# Patient Record
Sex: Male | Born: 1955 | Race: White | Hispanic: No | Marital: Single | Smoking: Former smoker
Health system: Southern US, Community
[De-identification: ages and names within clinical notes are randomized; demographics above are authoritative.]

## PROBLEM LIST (undated history)

## (undated) DIAGNOSIS — E119 Type 2 diabetes mellitus without complications: Secondary | ICD-10-CM

## (undated) DIAGNOSIS — H47011 Ischemic optic neuropathy, right eye: Secondary | ICD-10-CM

## (undated) DIAGNOSIS — I10 Essential (primary) hypertension: Secondary | ICD-10-CM

## (undated) DIAGNOSIS — E079 Disorder of thyroid, unspecified: Secondary | ICD-10-CM

## (undated) DIAGNOSIS — H6090 Unspecified otitis externa, unspecified ear: Secondary | ICD-10-CM

## (undated) DIAGNOSIS — E669 Obesity, unspecified: Secondary | ICD-10-CM

## (undated) DIAGNOSIS — K219 Gastro-esophageal reflux disease without esophagitis: Secondary | ICD-10-CM

---

## 2006-09-04 ENCOUNTER — Ambulatory Visit: Payer: Self-pay | Admitting: Unknown Physician Specialty

## 2009-08-14 ENCOUNTER — Emergency Department: Payer: Self-pay | Admitting: Internal Medicine

## 2012-03-18 ENCOUNTER — Ambulatory Visit: Payer: Self-pay | Admitting: Unknown Physician Specialty

## 2016-12-09 ENCOUNTER — Ambulatory Visit
Admission: EM | Admit: 2016-12-09 | Discharge: 2016-12-09 | Disposition: A | Discharge status: Home / Self Care | Payer: 59 | Attending: Family Medicine | Admitting: Family Medicine

## 2016-12-09 ENCOUNTER — Encounter: Payer: Self-pay | Admitting: Gynecology

## 2016-12-09 DIAGNOSIS — M71021 Abscess of bursa, right elbow: Secondary | ICD-10-CM

## 2016-12-09 DIAGNOSIS — W19XXXA Unspecified fall, initial encounter: Secondary | ICD-10-CM

## 2016-12-09 DIAGNOSIS — M25521 Pain in right elbow: Secondary | ICD-10-CM | POA: Diagnosis not present

## 2016-12-09 HISTORY — DX: Disorder of thyroid, unspecified: E07.9

## 2016-12-09 HISTORY — DX: Type 2 diabetes mellitus without complications: E11.9

## 2016-12-09 HISTORY — DX: Gastro-esophageal reflux disease without esophagitis: K21.9

## 2016-12-09 HISTORY — DX: Ischemic optic neuropathy, right eye: H47.011

## 2016-12-09 HISTORY — DX: Obesity, unspecified: E66.9

## 2016-12-09 HISTORY — DX: Essential (primary) hypertension: I10

## 2016-12-09 HISTORY — DX: Unspecified otitis externa, unspecified ear: H60.90

## 2016-12-09 MED ORDER — MUPIROCIN 2 % EX OINT: 1.0000 "application " | TOPICAL_OINTMENT | Freq: Two times a day (BID) | CUTANEOUS | 0 refills | Status: AC

## 2016-12-09 MED ORDER — CEFTRIAXONE SODIUM 1 G IJ SOLR
1.0000 g | Freq: Once | INTRAMUSCULAR | Status: AC
  Administered 2016-12-09: 1 g via INTRAMUSCULAR

## 2016-12-09 MED ORDER — SULFAMETHOXAZOLE-TRIMETHOPRIM 800-160 MG PO TABS: 1.0000 | ORAL_TABLET | Freq: Two times a day (BID) | ORAL | 0 refills | Status: AC

## 2016-12-09 NOTE — ED Provider Notes (Signed)
[facility]    CSN: [id number] Arrival date & time: 12/09/16  1028     History   Chief Complaint Chief Complaint  Patient presents with  . Elbow Problem    HPI Daniel Evans is a 61 y.o. male.   Patient is a 61 year old white male has history diabetes and hypertension who states he slipped out of his [employer]iece going to the house and landed on the floor. He does not remember hitting his right elbowbasically a pus pocket developed a right elbow swelling the right elbow redness and inflammation. He states that the left leg they don't use that he landed on his doing fine no loss of consciousness. Right elbow continued to swell and get red. He has a history of eczema and dry skin of his extremities. We'll see does not remember hitting the elbow but pus pocket did develop very shortly after that had continued pain and the pain has continued to get worse as elbow Red Swollen   The history is provided by the patient. No language interpreter was used.  Arm Injury  Location:  Elbow Elbow location:  R elbow Injury: yes   Mechanism of injury: fall   Fall:    Fall occurred:  From a Automotive engineer of impact:  Unable to specify   Entrapped after fall: no   Handedness:  Right-handed Worsened by:  Nothing   Past Medical History:  Diagnosis Date  . 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 Diabetes mellitus without complication (HCC)   . GERD (gastroesophageal reflux disease)   . Hypertension   . Obesity (BMI 35.0-39.9 without comorbidity)   . Otitis externa   . Thyroid disease     There are no active problems to display for this patient.   History reviewed. No pertinent surgical history.     Home Medications    Prior to Admission medications   Medication Sig Start Date End Date Taking? Authorizing Provider  acetaminophen (TYLENOL) 500 MG tablet Take 500 mg by mouth every 6 (six) hours as needed.   Yes [provider]  aspirin EC 81 MG tablet Take 81 mg  by mouth daily.   Yes [provider]  atorvastatin (LIPITOR) 20 MG tablet Take 20 mg by mouth daily.   Yes [provider]  fluticasone (VERAMYST) 27.5 MCG/SPRAY nasal spray Place 2 sprays into the nose daily.   Yes [provider]  glucose blood test strip 1 each by Other route as needed for other. Use as instructed   Yes [provider]  glyBURIDE (DIABETA) 5 MG tablet Take 5 mg by mouth daily with breakfast.   Yes [provider]  Insulin Glargine (LANTUS) 100 UNIT/ML Solostar Pen Inject into the skin daily at 10 pm.   Yes [provider]  insulin lispro protamine-lispro (HUMALOG 50/50 MIX) (50-50) 100 UNIT/ML SUSP injection Inject into the skin 2 (two) times daily before a meal.   Yes [provider]  levothyroxine (SYNTHROID, LEVOTHROID) 88 MCG tablet Take 88 mcg by mouth daily before breakfast.   Yes [provider]  lisinopril (PRINIVIL,ZESTRIL) 40 MG tablet Take 40 mg by mouth daily.   Yes [provider]  metFORMIN (GLUCOPHAGE) 850 MG tablet Take 850 mg by mouth 2 (two) times daily with a meal.   Yes [provider]  Multiple Vitamins-Minerals (MULTIVITAMIN WITH MINERALS) tablet Take 1 tablet by mouth daily.   Yes [provider]  ranitidine (ZANTAC) 150 MG  tablet Take 150 mg by mouth 2 (two) times daily.   Yes [provider]  mupirocin ointment (BACTROBAN) 2 % Apply 1 application topically 2 (two) times daily. 12/09/16   Hassan Rowan, MD  sulfamethoxazole-trimethoprim (BACTRIM DS,SEPTRA DS) 800-160 MG tablet Take 1 tablet by mouth 2 (two) times daily. 12/09/16 12/16/16  Hassan Rowan, MD    Family History No family history on file.  Social History Social History  Substance Use Topics  . Smoking status: Current Every Day Smoker    Packs/day: 1.00    Types: Cigarettes  . Smokeless tobacco: Never Used  . Alcohol use No     Allergies   Patient has no known  allergies.   Review of Systems Review of Systems  Musculoskeletal: Positive for arthralgias, joint swelling and myalgias.  All other systems reviewed and are negative.    Physical Exam Triage Vital Signs ED Triage Vitals  Enc Vitals Group     BP 12/09/16 1112 (!) 142/72     Pulse Rate 12/09/16 1112 78     Resp 12/09/16 1112 16     Temp 12/09/16 1112 98.1 F (36.7 C)     Temp Source 12/09/16 1112 Oral     SpO2 12/09/16 1112 97 %     Weight 12/09/16 1111 293 lb (132.9 kg)     Height 12/09/16 1111  (1.778 m)     Head Circumference --      Peak Flow --      Pain Score 12/09/16 1114 2     Pain Loc --      Pain Edu? --      Excl. in GC? --    No data found.   Updated Vital Signs BP (!) 142/72 (BP Location: Left Arm)   Pulse 78   Temp 98.1 F (36.7 C) (Oral)   Resp 16   Ht  (1.778 m)   Wt 293 lb (132.9 kg)   SpO2 97%   BMI 42.04 kg/m   Visual Acuity Right Eye Distance:   Left Eye Distance:   Bilateral Distance:    Right Eye Near:   Left Eye Near:    Bilateral Near:     Physical Exam  Constitutional: He is oriented to person, place, and time. He appears well-developed.  HENT:  Head: Normocephalic and atraumatic.  Eyes: Pupils are equal, round, and reactive to light.  Neck: Normal range of motion. Neck supple.  Pulmonary/Chest: Effort normal.  Musculoskeletal: He exhibits tenderness.       Right elbow: He exhibits swelling and effusion.       Arms: Neurological: He is alert and oriented to person, place, and time.  Skin: Lesion noted. There is erythema.     Over the right elbow bursa is a small area see a pus pocket there is a large 1 and a small one as well. This redness around the elbow as well  Psychiatric: He has a normal mood and affect.  Vitals reviewed.    UC Treatments / Results  Labs (all labs ordered are listed, but only abnormal results are displayed) Labs Reviewed  AEROBIC CULTURE (SUPERFICIAL SPECIMEN)    EKG  EKG  Interpretation None       Radiology No results found.  Procedures .Marland KitchenIncision and Drainage Date/Time: 12/09/2016 1:10 PM Performed by: Hassan Rowan Authorized by: Hassan Rowan   Consent:    Consent obtained:  Verbal Location:    Type:  Bursa   Location:  Upper extremity  Upper extremity location:  Elbow   Elbow location:  R elbow Pre-procedure details:    Skin preparation:  Betadine and Chloraprep Procedure type:    Complexity:  Simple Procedure details:    Incision types:  Stab incision   Incision depth:  Submucosal   Scalpel blade:  11   Wound management:  Probed and deloculated   Drainage:  Purulent   Drainage amount:  Scant   Wound treatment:  Wound left open   Packing materials:  None Post-procedure details:    Patient tolerance of procedure:  Tolerated well, no immediate complications Comments:     The right elbow bursa was exposed and at the pus pocket. Culture of the wound was done pus and blood expressed with subsequent retraction of the bursa. There is some scarring around the bursa which does allow [place] to put packing in but the swelling has gone down as well   (including critical care time)  Medications Ordered in UC Medications  cefTRIAXone (ROCEPHIN) injection 1 g (not administered)     Initial Impression / Assessment and Plan / UC Course  I have reviewed the triage vital signs and the nursing notes.  Pertinent labs & imaging results that were available during my care of the patient were reviewed by me and considered in my medical decision making (see chart for details).     Because unable to pack the bursa would not try any further packing placed on Bactrim ointment 2 times a day Septra DS 1 tablet twice a day follow-up Monday here for wound check to make sure things doing well and will give a gram of Rocephin IM as well  Final Clinical Impressions(s) / UC Diagnoses   Final diagnoses:  Abscess of bursa of right elbow    New Prescriptions New  Prescriptions   MUPIROCIN OINTMENT (BACTROBAN) 2 %    Apply 1 application topically 2 (two) times daily.   SULFAMETHOXAZOLE-TRIMETHOPRIM (BACTRIM DS,SEPTRA DS) 800-160 MG TABLET    Take 1 tablet by mouth 2 (two) times daily.    Note: This dictation was prepared with Dragon dictation along with smaller phrase technology. Any transcriptional errors that result from this process are unintentional. Controlled Substance Prescriptions [place] Controlled Substance Registry consulted? Not Applicable   Hassan Rowan, MD 12/09/16 1319

## 2016-12-09 NOTE — ED Triage Notes (Signed)
Patient c/o Right elbow redness/ warm to the touch and pus pockets x 1 week.

## 2016-12-11 ENCOUNTER — Encounter: Payer: Self-pay | Admitting: *Deleted

## 2016-12-11 ENCOUNTER — Ambulatory Visit
Admit: 2016-12-11 | Discharge: 2016-12-11 | Disposition: A | Discharge status: Home / Self Care | Payer: 59 | Attending: *Deleted | Admitting: *Deleted

## 2016-12-11 ENCOUNTER — Ambulatory Visit
Admission: EM | Admit: 2016-12-11 | Discharge: 2016-12-11 | Disposition: A | Discharge status: Home / Self Care | Payer: 59 | Attending: Family Medicine | Admitting: Family Medicine

## 2016-12-11 ENCOUNTER — Ambulatory Visit (INDEPENDENT_AMBULATORY_CARE_PROVIDER_SITE_OTHER): Payer: 59

## 2016-12-11 DIAGNOSIS — M79621 Pain in right upper arm: Secondary | ICD-10-CM | POA: Insufficient documentation

## 2016-12-11 DIAGNOSIS — L03113 Cellulitis of right upper limb: Secondary | ICD-10-CM | POA: Diagnosis not present

## 2016-12-11 DIAGNOSIS — M7989 Other specified soft tissue disorders: Secondary | ICD-10-CM | POA: Insufficient documentation

## 2016-12-11 DIAGNOSIS — S51001D Unspecified open wound of right elbow, subsequent encounter: Secondary | ICD-10-CM | POA: Diagnosis not present

## 2016-12-11 NOTE — ED Triage Notes (Signed)
Seen here Saturday for abcess to right elbow. Here today for wound check.

## 2016-12-11 NOTE — Discharge Instructions (Signed)
Take medication as prescribed. Rest. Drink plenty of fluids. Keep clean. Elevate.   Have ultrasound and labs completed.   Follow up with your primary care physician or return to urgent care in 2-3 days for recheck.  Return to Urgent care for new or worsening concerns.

## 2016-12-11 NOTE — ED Provider Notes (Signed)
[facility] ____________________________________________  Time seen: Approximately 11:36 AM  I have reviewed the triage vital signs and the nursing notes.   HISTORY  Chief Complaint Wound Check  HPI Daniel Evans is a 61 y.o. male  presenting for reevaluation of right elbow pain, redness and swelling. Patient reports that he was seen in urgent care 2 days ago for the same complaint at that time he had the area opened and drained. Patient states some of the swelling has decreased, but overall the arm continues to remain swollen, and with redness. Patient states he has not had much drainage continue at home. Denies fevers, decreased range of motion, paresthesias or decreased strength. Denies trauma, insect bite or known trigger, except for later stating he has eczema and thinks that he may have pulled a piece of skin that may have initiated the infection. Reports right hand dominant. Reports continues to eat and drink well. Patient states that he received a shot of an antibiotic when he was at the urgent care on Saturday, states he has not been on any oral or topical antibiotics at home. Patient states he did not know he was supposed to be taking anything.  Denies chest pain, shortness of breath, abdominal pain, dysuria. Denies recent sickness. Denies recent antibiotic use. Reports is continue to remain active. Denies renal insufficiency. Denies cardiac history. States diabetic, and states that currently working hard to get his A1c back down states his last round 9. Denies recent sickness or recent antibiotic use other than mentioned above. His last tetanus immunization is within last 10 years. Denies other skin changes. Denies history of MRSA.   PCP: [facility]   Past Medical History:  Diagnosis Date  . 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 Diabetes mellitus without complication (HCC)   . GERD (gastroesophageal reflux disease)   . Hypertension   . Obesity (BMI 35.0-39.9  without comorbidity)   . Otitis externa   . Thyroid disease     There are no active problems to display for this patient.   History reviewed. No pertinent surgical history.   No current facility-administered medications for this encounter.   Current Outpatient Prescriptions:  .  acetaminophen (TYLENOL) 500 MG tablet, Take 500 mg by mouth every 6 (six) hours as needed., Disp: , Rfl:  .  aspirin EC 81 MG tablet, Take 81 mg by mouth daily., Disp: , Rfl:  .  atorvastatin (LIPITOR) 20 MG tablet, Take 20 mg by mouth daily., Disp: , Rfl:  .  fluticasone (VERAMYST) 27.5 MCG/SPRAY nasal spray, Place 2 sprays into the nose daily., Disp: , Rfl:  .  glucose blood test strip, 1 each by Other route as needed for other. Use as instructed, Disp: , Rfl:  .  glyBURIDE (DIABETA) 5 MG tablet, Take 5 mg by mouth daily with breakfast., Disp: , Rfl:  .  Insulin Glargine (LANTUS) 100 UNIT/ML Solostar Pen, Inject into the skin daily at 10 pm., Disp: , Rfl:  .  insulin lispro protamine-lispro (HUMALOG 50/50 MIX) (50-50) 100 UNIT/ML SUSP injection, Inject into the skin 2 (two) times daily before a meal., Disp: , Rfl:  .  levothyroxine (SYNTHROID, LEVOTHROID) 88 MCG tablet, Take 88 mcg by mouth daily before breakfast., Disp: , Rfl:  .  lisinopril (PRINIVIL,ZESTRIL) 40 MG tablet, Take 40 mg by mouth daily., Disp: , Rfl:  .  metFORMIN (GLUCOPHAGE) 850 MG tablet, Take 850 mg by mouth 2 (two) times daily with a meal., Disp: , Rfl:  .  Multiple Vitamins-Minerals (MULTIVITAMIN WITH MINERALS) tablet, Take 1 tablet by mouth daily., Disp: , Rfl:  .  mupirocin ointment (BACTROBAN) 2 %, Apply 1 application topically 2 (two) times daily., Disp: 22 g, Rfl: 0 .  ranitidine (ZANTAC) 150 MG tablet, Take 150 mg by mouth 2 (two) times daily., Disp: , Rfl:  .  sulfamethoxazole-trimethoprim (BACTRIM DS,SEPTRA DS) 800-160 MG tablet, Take 1 tablet by mouth 2 (two) times daily., Disp: 20 tablet, Rfl: 0  Allergies Patient has no known  allergies.   family history Mother: Renal insufficiency, DVTs  Social History Social History  Substance Use Topics  . Smoking status: Current Every Day Smoker    Packs/day: 1.00    Types: Cigarettes  . Smokeless tobacco: Never Used  . Alcohol use No    Review of Systems Constitutional: No fever/chills Cardiovascular: Denies chest pain. Respiratory: Denies shortness of breath. Gastrointestinal: No abdominal pain.  No nausea, no vomiting.   Musculoskeletal: Negative for back pain. Skin: As above.   ____________________________________________   PHYSICAL EXAM:  VITAL SIGNS: ED Triage Vitals  Enc Vitals Group     BP 12/11/16 1032 (!) 142/71     Pulse Rate 12/11/16 1032 73     Resp 12/11/16 1032 16     Temp 12/11/16 1032 97.7 F (36.5 C)     Temp Source 12/11/16 1032 Oral     SpO2 12/11/16 1032 98 %     Weight 12/11/16 1034 293 lb (132.9 kg)     Height 12/11/16 1034  (1.778 m)     Head Circumference --      Peak Flow --      Pain Score --      Pain Loc --      Pain Edu? --      Excl. in GC? --     Constitutional: Alert and oriented. Well appearing and in no acute distress. Cardiovascular: Normal rate, regular rhythm. Grossly normal heart sounds.  Good peripheral circulation. Respiratory: Normal respiratory effort without tachypnea nor retractions. Breath sounds are clear and equal bilaterally. No wheezes, rales, rhonchi. Gastrointestinal: Soft and nontender. Musculoskeletal:  No midline cervical, thoracic or lumbar tenderness to palpation. Bilateral distal radial pulses equal and easily palpated. Except: right elbow at proximal posterior forearm 2x2 cm area of loose macerated appearing skin with immediate surrounding mod erythema without drainage or fluctuance, diffuse induration and edema present to elbow and proximal forearm, Diffuse mild tenderness, and mild erythema surrounding elbow, 1+ pitting edema mid forearm, right upper arm nontender and no edema, right  arm with full range of motion without painful range of motion, bilateral hand grips strong and equal, no point bony tenderness, right upper extremity otherwise nontender. Neurologic:  Normal speech and language.  Speech is normal. No gait instability.  Skin:  Skin is warm, dry. As above. Psychiatric: Mood and affect are normal. Speech and behavior are normal. Patient exhibits appropriate insight and judgment   ___________________________________________   LABS (all labs ordered are listed, but only abnormal results are displayed)  Labs Reviewed - No data to display  RADIOLOGY  Dg Elbow Complete Right  Result Date: 12/11/2016 CLINICAL DATA:  Redness and swelling of the elbow. EXAM: RIGHT ELBOW - COMPLETE 3+ VIEW COMPARISON:  None. FINDINGS: There is no evidence of fracture, dislocation, or joint effusion. There is no evidence of arthropathy or other focal bone abnormality. There is edema in the soft tissues around the elbow. Prominent soft tissue swelling in the region of the olecranon  bursa. IMPRESSION: Findings consistent with cellulitis. Electronically Signed   By: Francene Boyers M.D.   On: 12/11/2016 11:59   US Venous Img Upper Uni Right  Result Date: 12/11/2016 CLINICAL DATA:  Right arm swelling and pain. EXAM: RIGHT UPPER EXTREMITY VENOUS DOPPLER ULTRASOUND TECHNIQUE: Gray-scale sonography with graded compression, as well as color Doppler and duplex ultrasound were performed to evaluate the upper extremity deep venous system from the level of the subclavian vein and including the jugular, axillary, basilic, radial, ulnar and upper cephalic vein. Spectral Doppler was utilized to evaluate flow at rest and with distal augmentation maneuvers. COMPARISON:  None. FINDINGS: Contralateral Subclavian Vein: Respiratory phasicity is normal and symmetric with the symptomatic side. No evidence of thrombus. Normal compressibility. Internal Jugular Vein: No evidence of thrombus. Normal compressibility,  respiratory phasicity and response to augmentation. Subclavian Vein: No evidence of thrombus. Normal compressibility, respiratory phasicity and response to augmentation. Axillary Vein: No evidence of thrombus. Normal compressibility, respiratory phasicity and response to augmentation. Cephalic Vein: No evidence of thrombus. Normal compressibility, respiratory phasicity and response to augmentation. Basilic Vein: No evidence of thrombus. Normal compressibility, respiratory phasicity and response to augmentation. Brachial Veins: No evidence of thrombus. Normal compressibility, respiratory phasicity and response to augmentation. Radial Veins: No evidence of thrombus. Normal compressibility, respiratory phasicity and response to augmentation. Ulnar Veins: No evidence of thrombus. Normal compressibility, respiratory phasicity and response to augmentation. Venous Reflux:  None visualized. Other Findings:  None visualized. IMPRESSION: No evidence of DVT within the right upper extremity. Electronically Signed   By: Obie Dredge M.D.   On: 12/11/2016 14:07   ____________________________________________   PROCEDURES Procedures     INITIAL IMPRESSION / ASSESSMENT AND PLAN / ED COURSE  Pertinent labs & imaging results that were available during my care of the patient were reviewed by me and considered in my medical decision making (see chart for details).  Well-appearing patient. No acute distress. Right elbow with continued erythema and edema present. Clinical appearance consistent with cellulitis. Patient has not been taking antibiotics at home, as he states he did not realize he was supposed to be. Right elbow x-ray findings consistent with cellulitis, no evidence of fracture. Bactrim and Bactroban Rx previously sent. Will obtain cbc, bmp labs. 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 Will also obtain right upper combination the ultrasound due to diffuse  swelling. Discussed in detail with patient starting antibiotics, elevation, keeping clean and close monitoring. Discussed with patient fever or increased pain, increased swelling or difficulty with range of motion proceed directly to the emergency room. Encourage wound check in 2-3 days.  Discussed follow up with Primary care physician this week. Discussed follow up and return parameters including no resolution or any worsening concerns. Patient verbalized understanding and agreed to plan.   ____________________________________________   FINAL CLINICAL IMPRESSION(S) / ED DIAGNOSES  Final diagnoses:  Elbow wound, right, subsequent encounter  Cellulitis of right elbow     Discharge Medication List as of 12/11/2016 12:17 PM      Note: This dictation was prepared with Dragon dictation along with smaller phrase technology. Any transcriptional errors that result from this process are unintentional.   ADDENDUM: 12/11/16 1900 : Discussed with patient right upper extremity ultrasound results, per radiologist negative for DVT. Result above. Lab results still pending, patient states he did have these labs drawn today. Patient states that he has antibiotics and has started them.  Renford Dills, NP 12/11/16 [phone]

## 2016-12-12 LAB — AEROBIC CULTURE W GRAM STAIN (SUPERFICIAL SPECIMEN)

## 2016-12-12 LAB — AEROBIC CULTURE  (SUPERFICIAL SPECIMEN): SPECIAL REQUESTS: NORMAL

## 2019-03-19 IMAGING — CR DG ELBOW COMPLETE 3+V*R*
4 series · 5 of 5 positions shown · non-contrast
Comparison: None.

CLINICAL DATA: Redness and swelling of the elbow.

EXAM:
RIGHT ELBOW - COMPLETE 3+ VIEW

[elbow ap]
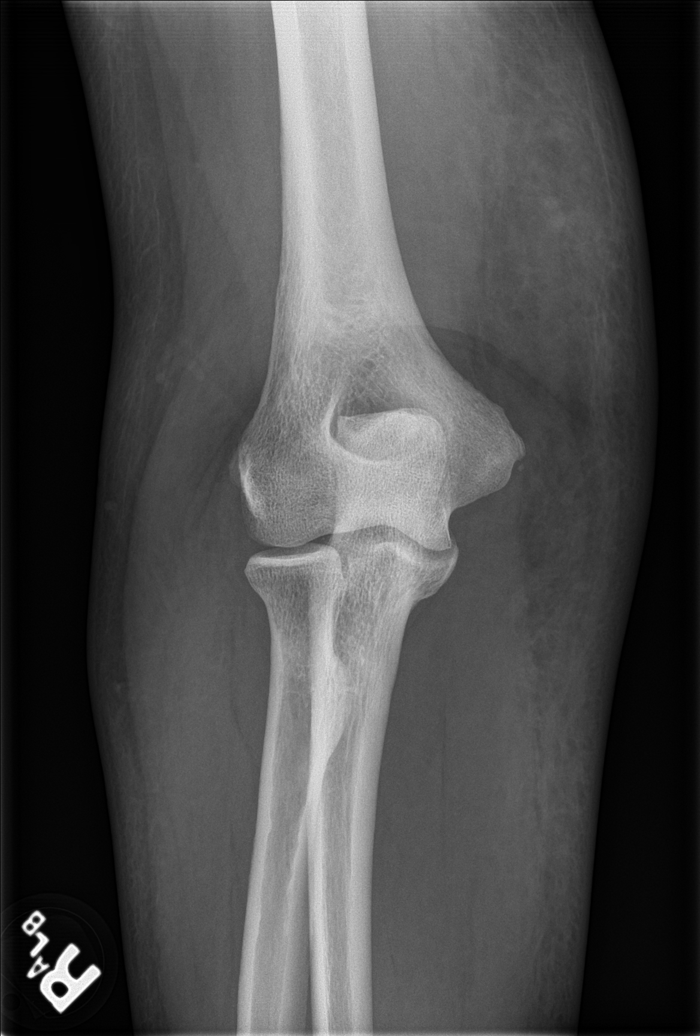

[elbow obl (1 of 2)]
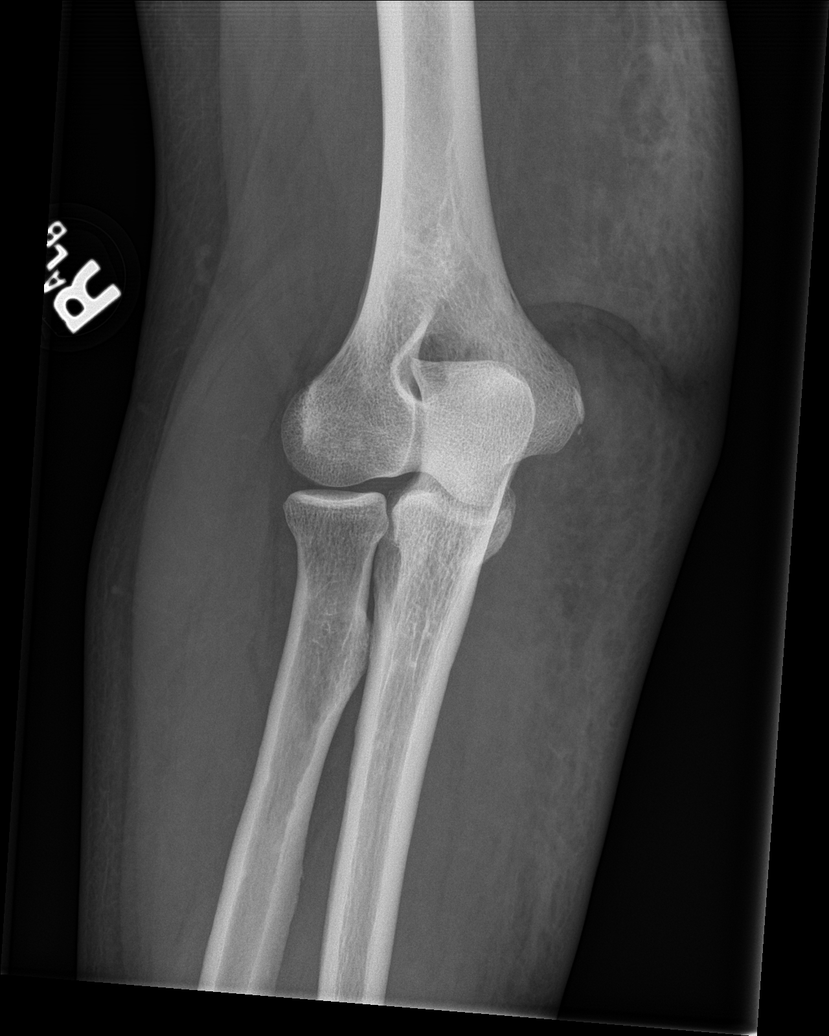

[elbow obl (2 of 2)]
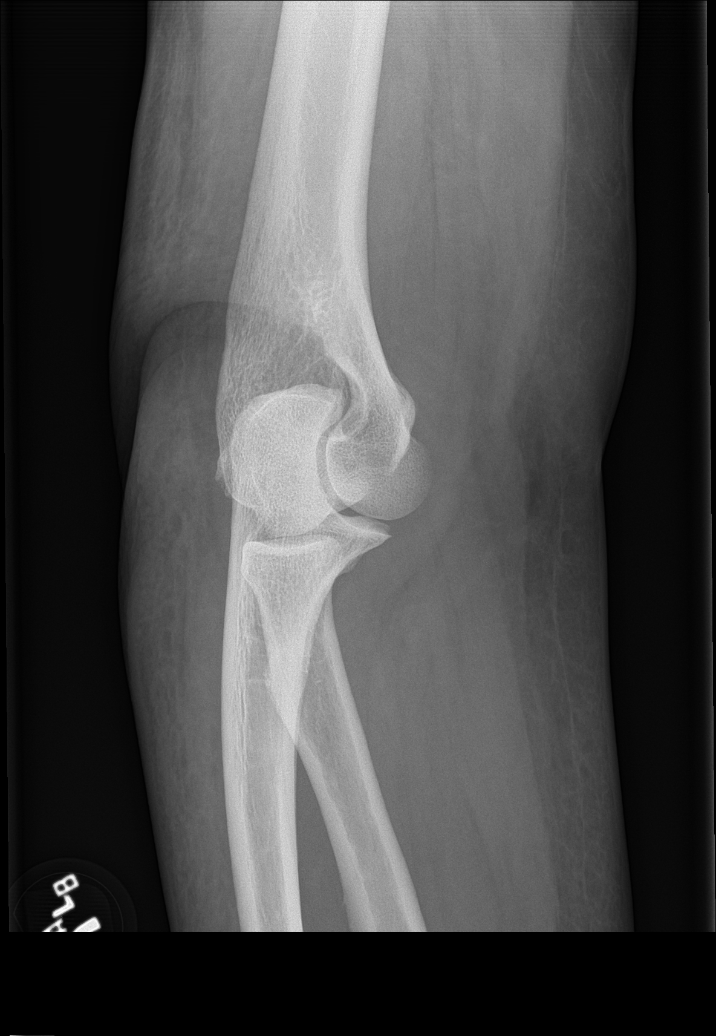

[Series 4: elbow lat · 0.14mm/px · 2 of 2 slices shown]
[im 1/2]
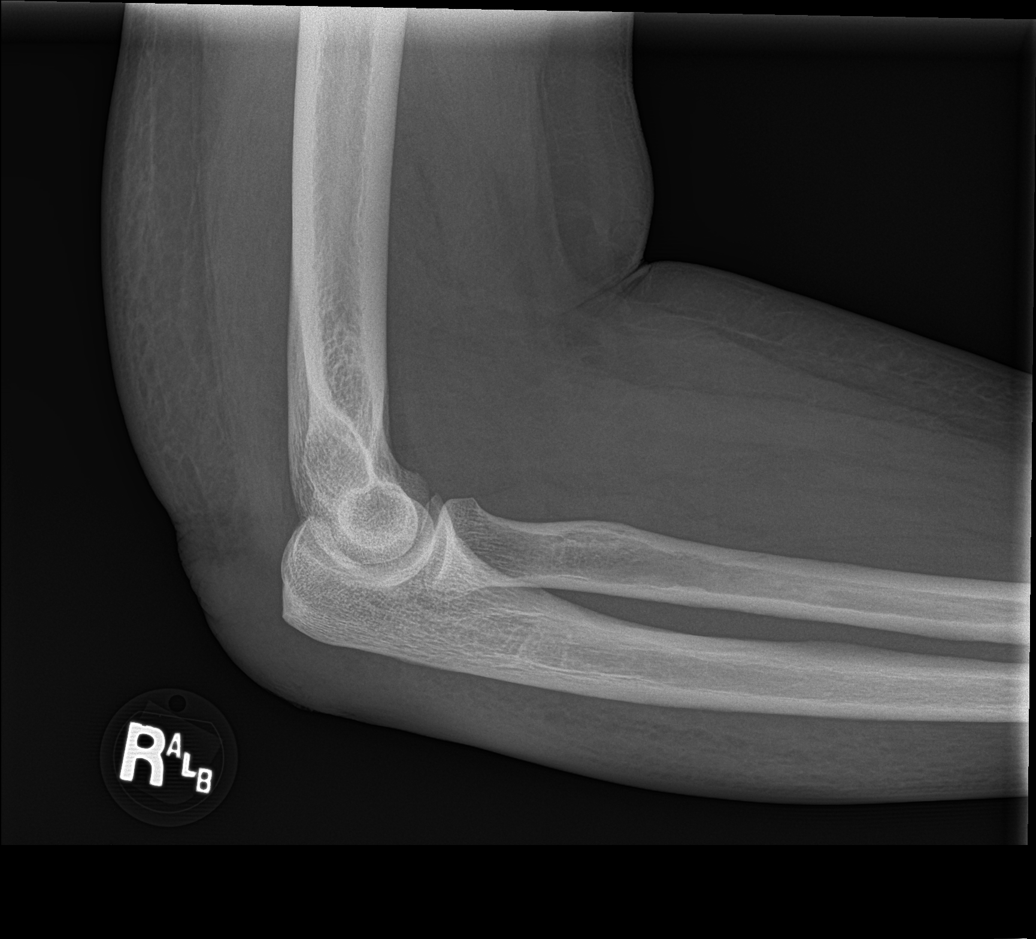
[im 2/2]
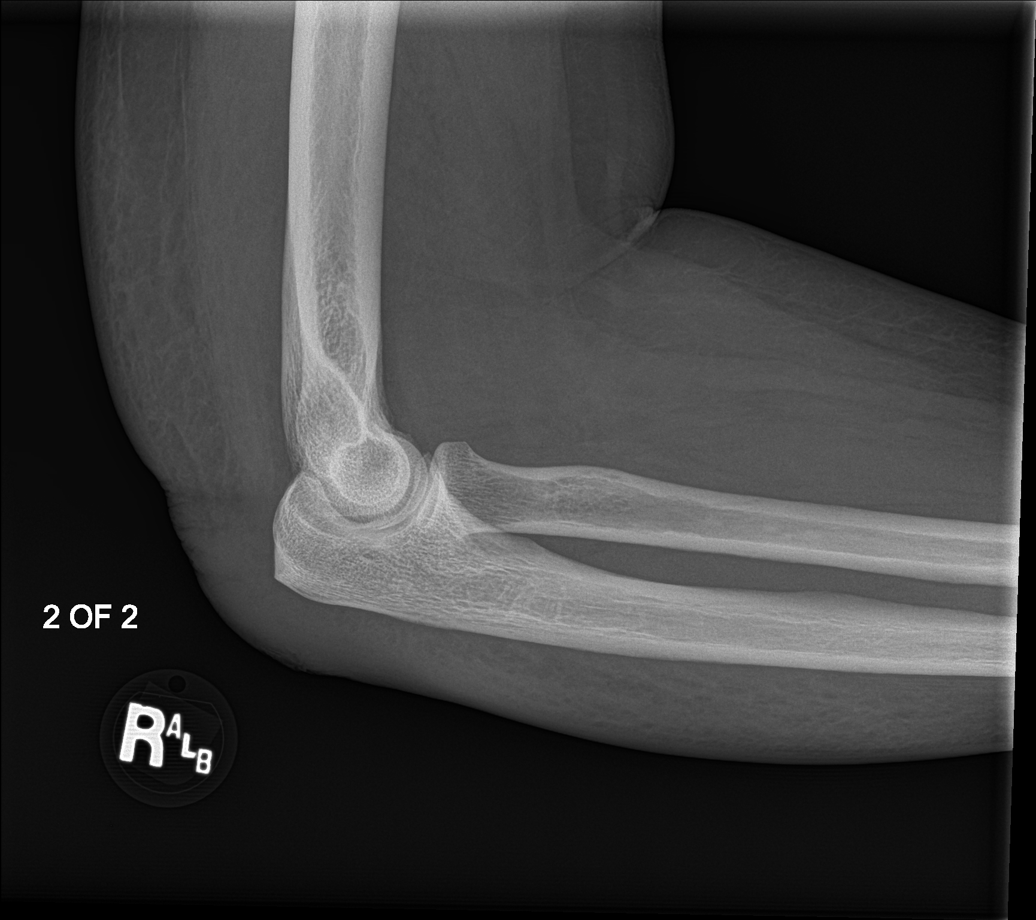

[5 of 5 positions shown; findings below may reference images not displayed]

FINDINGS: There is no evidence of fracture, dislocation, or joint effusion.
There is no evidence of arthropathy or other focal bone abnormality.
There is edema in the soft tissues around the elbow. Prominent soft
tissue swelling in the region of the olecranon bursa.
IMPRESSION: Findings consistent with cellulitis.

## 2022-08-30 ENCOUNTER — Other Ambulatory Visit: Payer: Self-pay | Admitting: Specialist

## 2022-08-30 DIAGNOSIS — R911 Solitary pulmonary nodule: Secondary | ICD-10-CM

## 2022-09-19 ENCOUNTER — Other Ambulatory Visit: Payer: Self-pay | Admitting: Specialist

## 2022-09-19 ENCOUNTER — Ambulatory Visit
Admission: RE | Admit: 2022-09-19 | Discharge: 2022-09-19 | Disposition: A | Discharge status: Home / Self Care | Payer: Medicare HMO | Source: Ambulatory Visit | Attending: Specialist | Admitting: Specialist

## 2022-09-19 DIAGNOSIS — R911 Solitary pulmonary nodule: Secondary | ICD-10-CM

## 2022-11-22 ENCOUNTER — Encounter: Payer: Self-pay | Admitting: Internal Medicine

## 2022-11-28 ENCOUNTER — Encounter: Payer: Self-pay | Admitting: Internal Medicine

## 2022-11-29 ENCOUNTER — Ambulatory Visit
Admission: RE | Admit: 2022-11-29 | Discharge: 2022-11-29 | Disposition: A | Discharge status: Home / Self Care | Payer: Medicare HMO | Attending: Internal Medicine | Admitting: Internal Medicine

## 2022-11-29 ENCOUNTER — Ambulatory Visit: Payer: Medicare HMO | Admitting: Anesthesiology

## 2022-11-29 ENCOUNTER — Encounter: Payer: Self-pay | Admitting: Internal Medicine

## 2022-11-29 ENCOUNTER — Encounter
Admission: RE | Disposition: A | Discharge status: Home / Self Care | Payer: Self-pay | Source: Home / Self Care | Attending: Internal Medicine

## 2022-11-29 DIAGNOSIS — Z79899 Other long term (current) drug therapy: Secondary | ICD-10-CM | POA: Diagnosis not present

## 2022-11-29 DIAGNOSIS — I10 Essential (primary) hypertension: Secondary | ICD-10-CM | POA: Diagnosis not present

## 2022-11-29 DIAGNOSIS — K219 Gastro-esophageal reflux disease without esophagitis: Secondary | ICD-10-CM | POA: Diagnosis not present

## 2022-11-29 DIAGNOSIS — Z87891 Personal history of nicotine dependence: Secondary | ICD-10-CM | POA: Diagnosis not present

## 2022-11-29 DIAGNOSIS — R195 Other fecal abnormalities: Secondary | ICD-10-CM | POA: Diagnosis present

## 2022-11-29 DIAGNOSIS — Z1211 Encounter for screening for malignant neoplasm of colon: Secondary | ICD-10-CM | POA: Insufficient documentation

## 2022-11-29 DIAGNOSIS — E039 Hypothyroidism, unspecified: Secondary | ICD-10-CM | POA: Diagnosis not present

## 2022-11-29 DIAGNOSIS — Z794 Long term (current) use of insulin: Secondary | ICD-10-CM | POA: Diagnosis not present

## 2022-11-29 DIAGNOSIS — E119 Type 2 diabetes mellitus without complications: Secondary | ICD-10-CM | POA: Insufficient documentation

## 2022-11-29 DIAGNOSIS — Z7985 Long-term (current) use of injectable non-insulin antidiabetic drugs: Secondary | ICD-10-CM | POA: Diagnosis not present

## 2022-11-29 DIAGNOSIS — Z7984 Long term (current) use of oral hypoglycemic drugs: Secondary | ICD-10-CM | POA: Insufficient documentation

## 2022-11-29 DIAGNOSIS — D123 Benign neoplasm of transverse colon: Secondary | ICD-10-CM | POA: Diagnosis not present

## 2022-11-29 HISTORY — PX: POLYPECTOMY: SHX5525

## 2022-11-29 HISTORY — PX: COLONOSCOPY WITH PROPOFOL: SHX5780

## 2022-11-29 LAB — GLUCOSE, CAPILLARY: Glucose-Capillary: 230 mg/dL — ABNORMAL HIGH (ref 70–99)

## 2022-11-29 SURGERY — COLONOSCOPY WITH PROPOFOL
Anesthesia: General

## 2022-11-29 MED ORDER — ONDANSETRON HCL 4 MG/2ML IJ SOLN
INTRAMUSCULAR | Status: DC | PRN
  Administered 2022-11-29: 4 mg via INTRAVENOUS

## 2022-11-29 MED ORDER — PROPOFOL 500 MG/50ML IV EMUL
INTRAVENOUS | Status: DC | PRN
  Administered 2022-11-29: 150 ug/kg/min via INTRAVENOUS
  Administered 2022-11-29: 50 mg via INTRAVENOUS

## 2022-11-29 MED ORDER — SODIUM CHLORIDE 0.9 % IV SOLN: INTRAVENOUS | Status: DC

## 2022-11-29 MED ORDER — PROPOFOL 10 MG/ML IV BOLUS
INTRAVENOUS | Status: AC
  Filled 2022-11-29: qty 20

## 2022-11-29 NOTE — Interval H&P Note (Signed)
History and Physical Interval Note:  11/29/2022 1:34 PM  Daniel Evans  has presented today for surgery, with the diagnosis of 787.7 (ICD-9-CM) - R19.5 (ICD-10-CM) - Positive colorectal cancer screening using Cologuard test.  The various methods of treatment have been discussed with the patient and family. After consideration of risks, benefits and other options for treatment, the patient has consented to  Procedure(s): COLONOSCOPY WITH PROPOFOL (N/A) as a surgical intervention.  The patient's history has been reviewed, patient examined, no change in status, stable for surgery.  I have reviewed the patient's chart and labs.  Questions were answered to the patient's satisfaction.     [place], [place]

## 2022-11-29 NOTE — Anesthesia Postprocedure Evaluation (Signed)
Anesthesia Post Note  Patient: Daniel Evans  Procedure(s) Performed: COLONOSCOPY WITH PROPOFOL POLYPECTOMY  Patient location during evaluation: Endoscopy Anesthesia Type: General Level of consciousness: awake and alert Pain management: pain level controlled Vital Signs Assessment: post-procedure vital signs reviewed and stable Respiratory status: spontaneous breathing, nonlabored ventilation, respiratory function stable and patient connected to nasal cannula oxygen Cardiovascular status: blood pressure returned to baseline and stable Postop Assessment: no apparent nausea or vomiting Anesthetic complications: no   No notable events documented.   Last Vitals:  Vitals:   11/29/22 1223 11/29/22 1422  BP: (!) 147/68 120/69  Pulse: 60   Resp: 20   Temp: (!) 35.7 C (!) 35.8 C  SpO2: 97%     Last Pain:  Vitals:   11/29/22 1442  TempSrc:   PainSc: 0-No pain                 Louie Boston

## 2022-11-29 NOTE — Transfer of Care (Signed)
Immediate Anesthesia Transfer of Care Note  Patient: Daniel Evans  Procedure(s) Performed: COLONOSCOPY WITH PROPOFOL POLYPECTOMY  Patient Location: PACU  Anesthesia Type:MAC  Level of Consciousness: drowsy  Airway & Oxygen Therapy: Patient Spontanous Breathing and Patient connected to face mask oxygen  Post-op Assessment: Report given to RN and Post -op Vital signs reviewed and stable  Post vital signs: Reviewed  Last Vitals:  Vitals Value Taken Time  BP 120/69 11/29/22 1422  Temp 65F   Pulse 80 11/29/22 1425  Resp 23 11/29/22 1425  SpO2 96 % 11/29/22 1425  Vitals shown include unfiled device data.  Last Pain:  Vitals:   11/29/22 1223  TempSrc: Temporal  PainSc: 0-No pain         Complications: No notable events documented.

## 2022-11-29 NOTE — H&P (Signed)
Outpatient short stay form Pre-procedure 11/29/2022 1:33 PM Daniel Evans K. Norma Fredrickson, M.D.  Primary Physician: Lorrine Kin, M.D.  Reason for visit:  Positive Cologuard test  History of present illness:  Patient is a pleasant 67 y/o male/male presenting on referral from primary care provider for a POSITIVE Cologuard result. Patient denies change in bowel habits, rectal bleeding, weight loss or abdominal pain.       Current Facility-Administered Medications:    0.9 %  sodium chloride infusion, , Intravenous, Continuous, [place], Boykin Nearing, MD, Last Rate: 20 mL/hr at 11/29/22 1246, New Bag at 11/29/22 1246  Medications Prior to Admission  Medication Sig Dispense Refill Last Dose   Exenatide ER (BYDUREON) 2 MG PEN Inject 2 mg into the skin once a week.   11/19/2022   glyBURIDE (DIABETA) 5 MG tablet Take 5 mg by mouth daily with breakfast.   11/28/2022   acetaminophen (TYLENOL) 500 MG tablet Take 500 mg by mouth every 6 (six) hours as needed.      aspirin EC 81 MG tablet Take 81 mg by mouth daily. (Patient not taking: Reported on 11/22/2022)   Not Taking   atorvastatin (LIPITOR) 20 MG tablet Take 20 mg by mouth daily.      fluticasone (VERAMYST) 27.5 MCG/SPRAY nasal spray Place 2 sprays into the nose daily.      glucose blood test strip 1 each by Other route as needed for other. Use as instructed      Insulin Glargine (LANTUS) 100 UNIT/ML Solostar Pen Inject into the skin daily at 10 pm.      insulin lispro protamine-lispro (HUMALOG 50/50 MIX) (50-50) 100 UNIT/ML SUSP injection Inject into the skin 2 (two) times daily before a meal.      levothyroxine (SYNTHROID, LEVOTHROID) 88 MCG tablet Take 88 mcg by mouth daily before breakfast.      lisinopril (PRINIVIL,ZESTRIL) 40 MG tablet Take 40 mg by mouth daily.      metFORMIN (GLUCOPHAGE) 850 MG tablet Take 850 mg by mouth 2 (two) times daily with a meal.   11/26/2022   Multiple Vitamins-Minerals (MULTIVITAMIN WITH MINERALS) tablet Take 1 tablet by  mouth daily.      mupirocin ointment (BACTROBAN) 2 % Apply 1 application topically 2 (two) times daily. 22 g 0    ranitidine (ZANTAC) 150 MG tablet Take 150 mg by mouth 2 (two) times daily.        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    Physical Exam  Gen: Alert, oriented. Appears stated age.  HEENT: [place]/AT. PERRLA. Lungs: CTA, no wheezes. CV: RR nl S1, S2. Abd: soft, benign, no masses. BS+ Ext: No edema. Pulses 2+    Planned procedures: Proceed with colonoscopy. The patient understands the nature of the planned procedure, indications, risks, alternatives and potential complications including but not limited to bleeding, infection, perforation, damage to internal organs and possible oversedation/side effects from anesthesia. The patient agrees and gives consent to proceed.  Please refer to procedure notes for findings, recommendations and patient disposition/instructions.     Cinde Ebert K. Norma Fredrickson, M.D. Gastroenterology 11/29/2022  1:33 PM

## 2022-11-29 NOTE — Anesthesia Preprocedure Evaluation (Signed)
Anesthesia Evaluation  Patient identified by MRN, date of birth, ID band Patient awake    Reviewed: Allergy & Precautions, NPO status , Patient's Chart, lab work & pertinent test results  History of Anesthesia Complications Negative for: history of anesthetic complications  Airway Mallampati: III  TM Distance: >3 FB Neck ROM: full    Dental  (+) Poor Dentition   Pulmonary neg pulmonary ROS, former smoker   Pulmonary exam normal        Cardiovascular hypertension, On Medications negative cardio ROS Normal cardiovascular exam     Neuro/Psych negative neurological ROS  negative psych ROS   GI/Hepatic Neg liver ROS,GERD  Medicated,,  Endo/Other  diabetesHypothyroidism  Morbid obesity  Renal/GU negative Renal ROS  negative genitourinary   Musculoskeletal   Abdominal   Peds  Hematology negative hematology ROS (+)   Anesthesia Other Findings Past Medical History: No date: 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 No pertinent surgical history.  BMI    Body Mass Index: 42.74 kg/m      Reproductive/Obstetrics negative OB ROS                             Anesthesia Physical Anesthesia Plan  ASA: 3  Anesthesia Plan: General   Post-op Pain Management: Minimal or no pain anticipated   Induction: Intravenous  PONV Risk Score and Plan: 1 and Propofol infusion and TIVA  Airway Management Planned: Natural Airway and Nasal Cannula  Additional Equipment:   Intra-op Plan:   Post-operative Plan:   Informed Consent: I have reviewed the patients History and Physical, chart, labs and discussed the procedure including the risks, benefits and  alternatives for the proposed anesthesia with the patient or authorized representative who has indicated his/her understanding and acceptance.     Dental Advisory Given  Plan Discussed with: Anesthesiologist, CRNA and Surgeon  Anesthesia Plan Comments: (Patient consented for risks of anesthesia including but not limited to:  - adverse reactions to medications - risk of airway placement if required - damage to eyes, teeth, lips or other oral mucosa - nerve damage due to positioning  - sore throat or hoarseness - Damage to heart, brain, nerves, lungs, other parts of body or loss of life  Patient voiced understanding.)        Anesthesia Quick Evaluation

## 2022-11-29 NOTE — Op Note (Signed)
[facility] Gastroenterology Patient Name: Daniel Evans Procedure Date: 11/29/2022 1:34 PM MRN: [id number] Account #: [id number] Date of Birth: 08-Feb-1956 Admit Type: Outpatient Age: 67 Room: [facility] ENDO ROOM 2 Gender: Male Note Status: Finalized Instrument Name: Colonscope [id number] Procedure:             Colonoscopy Indications:           Positive Cologuard test Providers:             Boykin Nearing. Norma Fredrickson MD, MD Referring MD:          Clifton Custard. Katrinka Blazing (Referring MD) Medicines:             Propofol per Anesthesia Complications:         No immediate complications. Estimated blood loss: None. Procedure:             Pre-Anesthesia Assessment:                        - The risks and benefits of the procedure and the                         sedation options and risks were discussed with the                         patient. All questions were answered and informed                         consent was obtained.                        - Patient identification and proposed procedure were                         verified prior to the procedure by the nurse. The                         procedure was verified in the procedure room.                        - ASA Grade Assessment: III - A patient with severe                         systemic disease.                        - After reviewing the risks and benefits, the patient                         was deemed in satisfactory condition to undergo the                         procedure.                        After obtaining informed consent, the colonoscope was                         passed under direct vision. Throughout the procedure,  the patient's blood pressure, pulse, and oxygen                         saturations were monitored continuously. The                         Colonoscope was introduced through the anus and                         advanced to the the cecum, identified by appendiceal                          orifice and ileocecal valve. The colonoscopy was                         technically difficult and complex due to significant                         looping, a tortuous colon and the patient's body                         habitus. Successful completion of the procedure was                         aided by applying abdominal pressure. The patient                         tolerated the procedure well. The quality of the bowel                         preparation was adequate. The ileocecal valve,                         appendiceal orifice, and rectum were photographed. Findings:      The perianal and digital rectal examinations were normal. Pertinent       negatives include normal sphincter tone and no palpable rectal lesions.      Two sessile polyps were found in the hepatic flexure. The polyps were 10       to 14 mm in size. These polyps were removed with a hot snare. Resection       and retrieval were complete.      The exam was otherwise without abnormality on direct and retroflexion       views. Impression:            - Two 10 to 14 mm polyps at the hepatic flexure,                         removed with a hot snare. Resected and retrieved.                        - The examination was otherwise normal on direct and                         retroflexion views. Recommendation:        - Patient has a contact number available for  emergencies. The signs and symptoms of potential                         delayed complications were discussed with the patient.                         Return to normal activities tomorrow. Written                         discharge instructions were provided to the patient.                        - Resume previous diet.                        - Continue present medications.                        - Repeat colonoscopy is recommended for surveillance.                         The colonoscopy date will be determined after                          pathology results from today's exam become available                         for review.                        - Return to GI office PRN.                        - The findings and recommendations were discussed with                         the patient. Procedure Code(s):     --- Professional ---                        [phone], Colonoscopy, flexible; with removal of                         tumor(s), polyp(s), or other lesion(s) by snare                         technique Diagnosis Code(s):     --- Professional ---                        R19.5, Other fecal abnormalities                        D12.3, Benign neoplasm of transverse colon (hepatic                         flexure or splenic flexure) CPT copyright 2022 [employer]. All rights reserved. The codes documented in this report are preliminary and upon coder review may  be revised to meet current compliance requirements. Stanton Kidney MD, MD 11/29/2022 2:23:55 PM This report has been signed electronically. Number of Addenda: 0 Note Initiated On: 11/29/2022 1:34 PM  Scope Withdrawal Time: 0 hours 8 minutes 27 seconds  Total Procedure Duration: 0 hours 23 minutes 45 seconds  Estimated Blood Loss:  Estimated blood loss: none.      [facility]

## 2022-11-30 ENCOUNTER — Encounter: Payer: Self-pay | Admitting: Internal Medicine

## 2022-11-30 LAB — SURGICAL PATHOLOGY

## 2023-01-18 ENCOUNTER — Other Ambulatory Visit: Payer: Self-pay | Admitting: Specialist

## 2023-01-18 DIAGNOSIS — J849 Interstitial pulmonary disease, unspecified: Secondary | ICD-10-CM

## 2023-01-18 DIAGNOSIS — G479 Sleep disorder, unspecified: Secondary | ICD-10-CM

## 2023-01-31 ENCOUNTER — Ambulatory Visit
Admission: RE | Admit: 2023-01-31 | Discharge: 2023-01-31 | Disposition: A | Discharge status: Home / Self Care | Payer: Medicare HMO | Source: Ambulatory Visit | Attending: Specialist | Admitting: Specialist

## 2023-01-31 ENCOUNTER — Other Ambulatory Visit: Payer: Self-pay | Admitting: Specialist

## 2023-01-31 DIAGNOSIS — J849 Interstitial pulmonary disease, unspecified: Secondary | ICD-10-CM | POA: Insufficient documentation

## 2023-01-31 DIAGNOSIS — G479 Sleep disorder, unspecified: Secondary | ICD-10-CM

## 2023-05-29 ENCOUNTER — Other Ambulatory Visit: Payer: Self-pay | Admitting: Internal Medicine

## 2023-05-29 DIAGNOSIS — I10 Essential (primary) hypertension: Secondary | ICD-10-CM

## 2023-05-29 DIAGNOSIS — E782 Mixed hyperlipidemia: Secondary | ICD-10-CM

## 2023-06-06 ENCOUNTER — Other Ambulatory Visit: Payer: Self-pay | Admitting: Internal Medicine

## 2023-06-06 DIAGNOSIS — E782 Mixed hyperlipidemia: Secondary | ICD-10-CM

## 2023-06-06 DIAGNOSIS — I10 Essential (primary) hypertension: Secondary | ICD-10-CM

## 2023-06-13 ENCOUNTER — Other Ambulatory Visit (HOSPITAL_COMMUNITY): Payer: Self-pay | Admitting: *Deleted

## 2023-06-13 ENCOUNTER — Telehealth (HOSPITAL_COMMUNITY): Payer: Self-pay | Admitting: *Deleted

## 2023-06-13 MED ORDER — METOPROLOL TARTRATE 50 MG PO TABS: ORAL_TABLET | ORAL | 0 refills | Status: AC

## 2023-06-13 NOTE — Telephone Encounter (Signed)
 Reaching out to patient to offer assistance regarding upcoming cardiac imaging study; pt verbalizes understanding of appt date/time, parking situation and where to check in, pre-test NPO status and medications ordered, and verified current allergies; name and call back number provided for further questions should they arise  Larey Brick RN Navigator Cardiac Imaging [facility] [phone] office [phone] cell  Patient to take 50mg  metoprolol tartrate two hours prior to his cardiac CT scan.

## 2023-06-14 ENCOUNTER — Other Ambulatory Visit: Payer: Self-pay | Admitting: Internal Medicine

## 2023-06-14 ENCOUNTER — Ambulatory Visit
Admission: RE | Admit: 2023-06-14 | Discharge: 2023-06-14 | Disposition: A | Discharge status: Home / Self Care | Source: Ambulatory Visit | Attending: Internal Medicine | Admitting: Internal Medicine

## 2023-06-14 DIAGNOSIS — R931 Abnormal findings on diagnostic imaging of heart and coronary circulation: Secondary | ICD-10-CM

## 2023-06-14 DIAGNOSIS — I2510 Atherosclerotic heart disease of native coronary artery without angina pectoris: Secondary | ICD-10-CM | POA: Diagnosis not present

## 2023-06-14 DIAGNOSIS — R918 Other nonspecific abnormal finding of lung field: Secondary | ICD-10-CM | POA: Diagnosis not present

## 2023-06-14 DIAGNOSIS — I10 Essential (primary) hypertension: Secondary | ICD-10-CM | POA: Diagnosis present

## 2023-06-14 DIAGNOSIS — E782 Mixed hyperlipidemia: Secondary | ICD-10-CM

## 2023-06-14 DIAGNOSIS — J9811 Atelectasis: Secondary | ICD-10-CM | POA: Insufficient documentation

## 2023-06-14 MED ORDER — IOHEXOL 350 MG/ML SOLN
100.0000 mL | Freq: Once | INTRAVENOUS | Status: AC | PRN
  Administered 2023-06-14: 100 mL via INTRAVENOUS

## 2023-06-14 MED ORDER — SODIUM CHLORIDE 0.9 % IV SOLN: INTRAVENOUS | Status: DC

## 2023-06-14 MED ORDER — METOPROLOL TARTRATE 5 MG/5ML IV SOLN
10.0000 mg | Freq: Once | INTRAVENOUS | Status: AC | PRN
  Administered 2023-06-14: 10 mg via INTRAVENOUS

## 2023-06-14 MED ORDER — NITROGLYCERIN 0.4 MG SL SUBL
0.8000 mg | SUBLINGUAL_TABLET | Freq: Once | SUBLINGUAL | Status: AC
  Administered 2023-06-14: 0.8 mg via SUBLINGUAL

## 2023-06-14 MED ORDER — DILTIAZEM HCL 25 MG/5ML IV SOLN
10.0000 mg | INTRAVENOUS | Status: DC | PRN
  Administered 2023-06-14: 10 mg via INTRAVENOUS

## 2023-06-14 NOTE — Progress Notes (Signed)
 Patient tolerated CT well. Drank water after. Vital signs stable encourage to drink water throughout day.Reasons explained and verbalized understanding. Ambulated steady gait.
